# Patient Record
Sex: Female | Born: 1975 | Race: White | Hispanic: Yes | Marital: Married | Smoking: Never smoker
Health system: Southern US, Community
[De-identification: ages and names within clinical notes are randomized; demographics above are authoritative.]

## PROBLEM LIST (undated history)

## (undated) DIAGNOSIS — E669 Obesity, unspecified: Secondary | ICD-10-CM

## (undated) HISTORY — PX: OTHER SURGICAL HISTORY: SHX169

## (undated) HISTORY — DX: Obesity, unspecified: E66.9

---

## 2008-08-27 ENCOUNTER — Ambulatory Visit: Payer: Self-pay | Admitting: Family Medicine

## 2008-11-06 ENCOUNTER — Ambulatory Visit: Payer: Self-pay | Admitting: Obstetrics & Gynecology

## 2015-03-23 ENCOUNTER — Emergency Department
Admission: EM | Admit: 2015-03-23 | Discharge: 2015-03-23 | Disposition: A | Discharge status: Home / Self Care | Payer: Self-pay | Attending: Emergency Medicine | Admitting: Emergency Medicine

## 2015-03-23 ENCOUNTER — Encounter: Payer: Self-pay | Admitting: Emergency Medicine

## 2015-03-23 DIAGNOSIS — B9689 Other specified bacterial agents as the cause of diseases classified elsewhere: Secondary | ICD-10-CM

## 2015-03-23 DIAGNOSIS — L089 Local infection of the skin and subcutaneous tissue, unspecified: Secondary | ICD-10-CM | POA: Insufficient documentation

## 2015-03-23 MED ORDER — OXYCODONE-ACETAMINOPHEN 7.5-325 MG PO TABS: 1.0000 | ORAL_TABLET | Freq: Four times a day (QID) | ORAL | Status: DC | PRN

## 2015-03-23 MED ORDER — IBUPROFEN 800 MG PO TABS: 800.0000 mg | ORAL_TABLET | Freq: Three times a day (TID) | ORAL | Status: DC | PRN

## 2015-03-23 MED ORDER — CEPHALEXIN 500 MG PO CAPS: 500.0000 mg | ORAL_CAPSULE | Freq: Four times a day (QID) | ORAL | Status: AC

## 2015-03-23 NOTE — ED Notes (Signed)
States she developed swelling and pain to left labia since last Thursday. Area swollen and tender

## 2015-03-23 NOTE — Discharge Instructions (Signed)
Take medication as directed and apply warm compresses to the area 3-4 times a day. Return to ER if increased swelling and pain in the next 48 hours. Advised patient did not breast-feed by taking Percocets. Celulitis (Cellulitis)  La celulitis es una infeccin de la piel y del tejido que se encuentra debajo de la piel. El rea infectada generalmente est de color rojo y duele. Ocurre con ms frecuencia en los brazos y en las piernas. CUIDADOS EN EL HOGAR   Tome los antibiticos tal como se le indic. Finalice el [place], aunque comience a Actorsentirse mejor.  Mantenga el brazo o la pierna infectada elevada.  Aplique paos calientes en la zona hasta 4 veces al da.  Tome slo los medicamentos que le haya indicado el mdico.  Cumpla con los controles mdicos segn las indicaciones. SOLICITE AYUDA SI:  Observa una lnea roja en la piel que sale desde la herida.  El rea roja se extiende o se vuelve de color oscuro.  El hueso o la articulacin que se encuentran por debajo de la zona infectada le duelen despus de que la piel se [place].  La infeccin se repite en la misma zona o en una zona diferente.  Tiene un bulto inflamado (hinchado) en la zona infectada.  Aparecen nuevos sntomas.  Tiene fiebre. SOLICITE AYUDA DE INMEDIATO SI:   Se siente muy somnoliento.  Vomita o tiene diarrea.  Se siente enfermo y tiene Savalas Monje Internationaldolores musculares.   Esta informacin no tiene Theme park managercomo fin reemplazar el consejo del mdico. Asegrese de hacerle al mdico cualquier pregunta que tenga.   Document Released: 10/27/2009 Document Revised: 01/28/2015 Elsevier Interactive Patient Education [employer]2016 Elsevier Inc.

## 2015-03-23 NOTE — ED Notes (Signed)
Pt to ed with c./o pelvic pain since wed night states area of bulging pain now unable to sit.

## 2015-03-23 NOTE — ED Provider Notes (Signed)
[facility] Emergency Department Provider Note  ____________________________________________  Time seen: Approximately 12:19 PM  I have reviewed the triage vital signs and the nursing notes.   HISTORY  Chief Complaint Pelvic Pain   Via interpreter HPI Karla Harris is a 39 y.o. female left labia majora pain for 5 days. Patient noticed some swelling and redness status post shaving her pubic area. Patient denies any discharge from his area. Patient denies any fever associated with this complaint. Patient state the pain is a 10 over 10 described as aching. Patient also admits that she is breast-feeding for 243 months old infant. No palliative measures taken for this complaint.   History reviewed. No pertinent past medical history.  There are no active problems to display for this patient.   History reviewed. No pertinent past surgical history.  Current Outpatient Rx  Name  Route  Sig  Dispense  Refill  . cephALEXin (KEFLEX) 500 MG capsule   Oral   Take 1 capsule (500 mg total) by mouth 4 (four) times daily.   40 capsule   0   . ibuprofen (ADVIL,MOTRIN) 800 MG tablet   Oral   Take 1 tablet (800 mg total) by mouth every 8 (eight) hours as needed for moderate pain.   15 tablet   0   . oxyCODONE-acetaminophen (PERCOCET) 7.5-325 MG tablet   Oral   Take 1 tablet by mouth every 6 (six) hours as needed for severe pain.   12 tablet   0     Allergies Review of patient's allergies indicates no known allergies.  History reviewed. No pertinent family history.  Social History Social History  Substance Use Topics  . Smoking status: Never Smoker   . Smokeless tobacco: None  . Alcohol Use: No    Review of Systems Constitutional: No fever/chills Eyes: No visual changes. ENT: No sore throat. Cardiovascular: Denies chest pain. Respiratory: Denies shortness of breath. Gastrointestinal: No abdominal pain.  No nausea, no vomiting.  No diarrhea.  No  constipation. Genitourinary: Negative for dysuria. Musculoskeletal: Negative for back pain. Skin: Negative for rash. Edema and erythema left labial majora. Neurological: Negative for headaches, focal weakness or numbness.  10-point ROS otherwise negative.  ____________________________________________   PHYSICAL EXAM:  VITAL SIGNS: ED Triage Vitals  Enc Vitals Group     BP 03/23/15 1125 129/83 mmHg     Pulse Rate 03/23/15 1125 82     Resp 03/23/15 1125 18     Temp 03/23/15 1125 99.2 F (37.3 C)     Temp Source 03/23/15 1125 Oral     SpO2 03/23/15 1125 100 %     Weight 03/23/15 1125 161 lb (73.029 kg)     Height --      Head Cir --      Peak Flow --      Pain Score 03/23/15 1126 10     Pain Loc --      Pain Edu? --      Excl. in GC? --     Constitutional: Alert and oriented. Well appearing and in no acute distress. Eyes: Conjunctivae are normal. PERRL. EOMI. Head: Atraumatic. Nose: No congestion/rhinnorhea. Mouth/Throat: Mucous membranes are moist.  Oropharynx non-erythematous. Neck: No stridor.   Hematological/Lymphatic/Immunilogical: No cervical lymphadenopathy. Cardiovascular: Normal rate, regular rhythm. Grossly normal heart sounds.  Good peripheral circulation. Respiratory: Normal respiratory effort.  No retractions. Lungs CTAB. Gastrointestinal: Soft and nontender. No distention. No abdominal bruits. No CVA tenderness. Musculoskeletal: No lower extremity tenderness nor  edema.  No joint effusions. Neurologic:  Normal speech and language. No gross focal neurologic deficits are appreciated. No gait instability. Skin:  Skin is warm, dry and intact. No rash noted. Non-fluctuant edema to the left labia majora area. Area is recently been shaved. There is no discharge from this area. Psychiatric: Mood and affect are normal. Speech and behavior are normal.  ____________________________________________   LABS (all labs ordered are listed, but only abnormal results are  displayed)  Labs Reviewed - No data to display ____________________________________________  EKG   ____________________________________________  RADIOLOGY   ____________________________________________   PROCEDURES  Procedure(s) performed: None  Critical Care performed: No  ____________________________________________   INITIAL IMPRESSION / ASSESSMENT AND PLAN / ED COURSE  Pertinent labs & imaging results that were available during my care of the patient were reviewed by me and considered in my medical decision making (see chart for details).  Cellulitis left labia majora area. Advised supportive care at this time. Discussed rationale for not on intervention with incision and drainage. Patient started on Keflex and ibuprofen and Percocets. Advised patient not to breast-feed until she finished a course of Percocets. Advised patient return  To ER 2 daysif condition worsens. ____________________________________________   FINAL CLINICAL IMPRESSION(S) / ED DIAGNOSES  Final diagnoses:  Bacterial skin infection      Joni Reining, PA-C 03/23/15 1227  Sharman Cheek, MD 03/24/15 2200

## 2017-12-04 ENCOUNTER — Encounter: Payer: Self-pay | Admitting: *Deleted

## 2017-12-04 ENCOUNTER — Other Ambulatory Visit: Payer: Self-pay | Admitting: *Deleted

## 2017-12-04 ENCOUNTER — Ambulatory Visit
Admission: RE | Admit: 2017-12-04 | Discharge: 2017-12-04 | Disposition: A | Discharge status: Home / Self Care | Payer: Self-pay | Source: Ambulatory Visit | Attending: Oncology | Admitting: Oncology

## 2017-12-04 ENCOUNTER — Encounter (INDEPENDENT_AMBULATORY_CARE_PROVIDER_SITE_OTHER): Payer: Self-pay

## 2017-12-04 ENCOUNTER — Ambulatory Visit: Payer: Self-pay | Attending: Oncology | Admitting: *Deleted

## 2017-12-04 VITALS — BP 110/73 | HR 69 | Temp 98.2°F | Ht 62.0 in | Wt 177.0 lb

## 2017-12-04 DIAGNOSIS — Z0000 Encounter for general adult medical examination without abnormal findings: Secondary | ICD-10-CM | POA: Insufficient documentation

## 2017-12-04 DIAGNOSIS — R920 Mammographic microcalcification found on diagnostic imaging of breast: Secondary | ICD-10-CM

## 2017-12-04 NOTE — Progress Notes (Signed)
  Subjective:     Patient ID: Karla Harris, female   DOB: 1975/07/23, 42 y.o.   MRN: [id number]  HPI   Review of Systems     Objective:   Physical Exam  Pulmonary/Chest: Right breast exhibits no inverted nipple, no mass, no nipple discharge, no skin change and no tenderness. Left breast exhibits no inverted nipple, no mass, no nipple discharge, no skin change and no tenderness.         Assessment:     42 year old Hispanic female referred to [facility] by [facility] baseline [facility].  Orson SlickJacqui, the interpreter present during the interview and exam.  Patient states she has bilateral breast tenderness 1 week prior to her menstrual cycle and 1 week after.  Describes as all over pain.  Discussed hormones and the menstrual cycle, and the likelihood her breast pain is related to her hormones.  She is to keep a chart to ensure the pain is associated with her cycle.  Clinical breast exam with bilateral symmetrical fibroglandular like tissue.  Taught self breast awareness.  Last pap on 06/03/15 was HPV negative ASCUS.  Informed patient next pap due in 2020.  Patient has been screened for eligibility.  She does not have any insurance, Medicare or Medicaid.  She also meets financial eligibility.  Hand-out given on the Affordable Care Act. Risk Assessment    Risk Scores      12/04/2017   Last edited by: Jim LikeLambert, Kordelia Severin M, RN   5-year risk: 0.3 %   Lifetime risk: 6.4 %            Plan:     Baseline screening mammogram ordered.  Will follow-up per [facility] protocol.

## 2017-12-04 NOTE — Patient Instructions (Signed)
Gave patient hand-out, Women Staying Healthy, Active and Well from [facility], with education on breast health, pap smears, heart and colon health. 

## 2017-12-08 ENCOUNTER — Ambulatory Visit
Admission: RE | Admit: 2017-12-08 | Discharge: 2017-12-08 | Disposition: A | Discharge status: Home / Self Care | Payer: Self-pay | Source: Ambulatory Visit | Attending: Oncology | Admitting: Oncology

## 2017-12-08 DIAGNOSIS — R920 Mammographic microcalcification found on diagnostic imaging of breast: Secondary | ICD-10-CM

## 2018-01-12 ENCOUNTER — Other Ambulatory Visit: Payer: Self-pay | Admitting: *Deleted

## 2018-01-12 ENCOUNTER — Encounter: Payer: Self-pay | Admitting: *Deleted

## 2018-01-12 DIAGNOSIS — R920 Mammographic microcalcification found on diagnostic imaging of breast: Secondary | ICD-10-CM

## 2018-01-12 NOTE — Progress Notes (Signed)
Letter mailed to inform patient of her 6 month follow-up mammogram appointment on 06/12/18 @ 11:20.  HSIS to [place].

## 2018-06-06 ENCOUNTER — Encounter: Payer: Self-pay | Admitting: *Deleted

## 2018-06-06 NOTE — Progress Notes (Signed)
Patient called Karla Harris to inform Karla Harris she is currently pregnant.  She was scheduled for a six month follow-up mammogram this month.  She was notified by Karla Harris and Karla Harris, the interpreter that we could not do a mammogram on her while pregnant.  She is to call Karla Harris after completion of breast feeding to reschedule her mammogram.  She will need to return to [facility] again at that time.

## 2018-11-20 DIAGNOSIS — O99810 Abnormal glucose complicating pregnancy: Secondary | ICD-10-CM

## 2018-11-20 DIAGNOSIS — Z9289 Personal history of other medical treatment: Secondary | ICD-10-CM

## 2018-11-20 DIAGNOSIS — Z559 Problems related to education and literacy, unspecified: Secondary | ICD-10-CM

## 2018-11-20 DIAGNOSIS — O1212 Gestational proteinuria, second trimester: Secondary | ICD-10-CM

## 2018-11-20 DIAGNOSIS — O09529 Supervision of elderly multigravida, unspecified trimester: Secondary | ICD-10-CM | POA: Insufficient documentation

## 2018-11-20 DIAGNOSIS — O09523 Supervision of elderly multigravida, third trimester: Secondary | ICD-10-CM

## 2018-11-20 DIAGNOSIS — E669 Obesity, unspecified: Secondary | ICD-10-CM | POA: Insufficient documentation

## 2018-11-20 HISTORY — DX: Gestational proteinuria, second trimester: O12.12

## 2018-11-20 HISTORY — DX: Abnormal glucose complicating pregnancy: O99.810

## 2018-11-21 ENCOUNTER — Ambulatory Visit: Payer: Self-pay | Admitting: Nurse Practitioner

## 2018-11-21 ENCOUNTER — Other Ambulatory Visit: Payer: Self-pay

## 2018-11-21 ENCOUNTER — Other Ambulatory Visit (HOSPITAL_COMMUNITY): Payer: Self-pay | Admitting: Family Medicine

## 2018-11-21 VITALS — BP 95/62 | Temp 98.6°F | Wt 191.2 lb

## 2018-11-21 DIAGNOSIS — O1212 Gestational proteinuria, second trimester: Secondary | ICD-10-CM

## 2018-11-21 DIAGNOSIS — O99013 Anemia complicating pregnancy, third trimester: Secondary | ICD-10-CM

## 2018-11-21 DIAGNOSIS — E669 Obesity, unspecified: Secondary | ICD-10-CM

## 2018-11-21 DIAGNOSIS — O99019 Anemia complicating pregnancy, unspecified trimester: Secondary | ICD-10-CM

## 2018-11-21 DIAGNOSIS — O99810 Abnormal glucose complicating pregnancy: Secondary | ICD-10-CM

## 2018-11-21 DIAGNOSIS — O09523 Supervision of elderly multigravida, third trimester: Secondary | ICD-10-CM

## 2018-11-21 HISTORY — DX: Anemia complicating pregnancy, unspecified trimester: O99.019

## 2018-11-21 LAB — URINALYSIS
Bilirubin, UA: NEGATIVE
Glucose, UA: NEGATIVE
Ketones, UA: NEGATIVE
Leukocytes,UA: NEGATIVE
Nitrite, UA: NEGATIVE
Specific Gravity, UA: 1.02 (ref 1.005–1.030)
Urobilinogen, Ur: 0.2 mg/dL (ref 0.2–1.0)
pH, UA: 7 (ref 5.0–7.5)

## 2018-11-21 LAB — HEMOGLOBIN: Hemoglobin: 10.8 g/dL — ABNORMAL LOW (ref 11.1–15.9)

## 2018-11-21 LAB — OB RESULTS CONSOLE HIV ANTIBODY (ROUTINE TESTING): HIV: NONREACTIVE

## 2018-11-21 MED ORDER — FERROUS SULFATE 325 (65 FE) MG PO TBEC: 325.0000 mg | DELAYED_RELEASE_TABLET | Freq: Every day | ORAL | 0 refills | Status: DC

## 2018-11-21 NOTE — Progress Notes (Signed)
Patient here for repeat 3 hr. Gtt. Hasn't been seen 10-28-2018 due to death of daughter. Kick counts reviewed with patient today. Patient states she does not want to reschedule [facility] MFM appt. Jenetta Downer, RN

## 2018-11-21 NOTE — Progress Notes (Signed)
    PRENATAL VISIT NOTE  Subjective:  Karla Harris is a 43 y.o. [id number] at [redacted]w[redacted]d being seen today for ongoing prenatal care.  She is currently monitored for the following issues for this high-risk pregnancy and has Problem with literacy; Obesity (BMI 30-39.9); Abnormal maternal glucose tolerance, antepartum; Proteinuria complicating pregnancy, second trimester; Supervision of elderly multigravida in third trimester; and History of positive PPD on their problem list.  Patient reports no complaints.   .  .   . denies vomiting, abdominal pain, fussiness, diarrhea, cough and difficulty breathing leaking of fluid/ROM.   The following portions of the patient's history were reviewed and updated as appropriate: allergies, current medications, past family history, past medical history, past social history, past surgical history and problem list. Problem list updated.  Objective:   Vitals:   11/21/18 0915  BP: 95/62  Temp: 98.6 F (37 C)  Weight: 191 lb 3.2 oz (86.7 kg)    Fetal Status:           General:  Alert, oriented and cooperative. Patient is in no acute distress.  Skin: Skin is warm and dry. No rash noted.   Cardiovascular: Normal heart rate noted  Respiratory: Normal respiratory effort, no problems with respiration noted  Abdomen: Soft, gravid, appropriate for gestational age.        Pelvic: Cervical exam deferred        Extremities: Normal range of motion.     Mental Status: Normal mood and affect. Normal behavior. Normal judgment and thought content.   Assessment and Plan:  Pregnancy: [id number] at [redacted]w[redacted]d  1. Obesity (BMI 30-39.9) Discussed proper weight gain during pregnancy 16 lb 3.2 oz (7.348 kg)   2. Abnormal maternal glucose tolerance, antepartum Discussed WIC services  3. Proteinuria complicating pregnancy, second trimester Plan to continue to monitor urine for protein  4. Supervision of elderly multigravida in third trimester Client doing well - admits  to "Inflammed veins in vagina while standing" - advised cool compresses and lying on side while in bed - advised to notify clinic if symptoms worsen or persists  Follow up with lab work may come in earlier [facility] to - Feet a little swelling - advised client to increase water to 8 glasses daily and limit salt in daily diet.  Admits to occasional - BH contractions   Preterm labor symptoms and general obstetric precautions including but not limited to vaginal bleeding, contractions, leaking of fluid and fetal movement were reviewed in detail with the patient. Please refer to After Visit Summary for other counseling recommendations.  No follow-ups on file.  No future appointments.  Berniece Andreas, NP

## 2018-11-22 LAB — GLUCOSE TOLERANCE TEST, 6 HOUR
Glucose, 1 Hour GTT: 145 mg/dL (ref 65–199)
Glucose, 2 hour: 132 mg/dL (ref 65–139)
Glucose, 3 hour: 105 mg/dL (ref 65–109)
Glucose, GTT - Fasting: 80 mg/dL (ref 65–99)

## 2018-11-22 LAB — FE+CBC/D/PLT+TIBC+FER+RETIC
Basophils Absolute: 0 10*3/uL (ref 0.0–0.2)
Basos: 0 %
EOS (ABSOLUTE): 0.1 10*3/uL (ref 0.0–0.4)
Eos: 1 %
Ferritin: 12 ng/mL — ABNORMAL LOW (ref 15–150)
Hematocrit: 33.1 % — ABNORMAL LOW (ref 34.0–46.6)
Hemoglobin: 11.1 g/dL (ref 11.1–15.9)
Immature Grans (Abs): 0.1 10*3/uL (ref 0.0–0.1)
Immature Granulocytes: 1 %
Iron Saturation: 11 % — ABNORMAL LOW (ref 15–55)
Iron: 56 ug/dL (ref 27–159)
Lymphocytes Absolute: 1.7 10*3/uL (ref 0.7–3.1)
Lymphs: 25 %
MCH: 31.5 pg (ref 26.6–33.0)
MCHC: 33.5 g/dL (ref 31.5–35.7)
MCV: 94 fL (ref 79–97)
Monocytes Absolute: 0.5 10*3/uL (ref 0.1–0.9)
Monocytes: 7 %
Neutrophils Absolute: 4.4 10*3/uL (ref 1.4–7.0)
Neutrophils: 66 %
Platelets: 219 10*3/uL (ref 150–450)
RBC: 3.52 x10E6/uL — ABNORMAL LOW (ref 3.77–5.28)
RDW: 14.3 % (ref 11.7–15.4)
Retic Ct Pct: 2.7 % — ABNORMAL HIGH (ref 0.6–2.6)
Total Iron Binding Capacity: 501 ug/dL — ABNORMAL HIGH (ref 250–450)
UIBC: 445 ug/dL — ABNORMAL HIGH (ref 131–425)
WBC: 6.8 10*3/uL (ref 3.4–10.8)

## 2018-11-22 LAB — RPR: RPR Ser Ql: NONREACTIVE

## 2018-12-05 ENCOUNTER — Other Ambulatory Visit: Payer: Self-pay

## 2018-12-05 ENCOUNTER — Ambulatory Visit: Payer: Self-pay | Admitting: Nurse Practitioner

## 2018-12-05 DIAGNOSIS — O1212 Gestational proteinuria, second trimester: Secondary | ICD-10-CM

## 2018-12-05 DIAGNOSIS — O99810 Abnormal glucose complicating pregnancy: Secondary | ICD-10-CM

## 2018-12-05 DIAGNOSIS — O09523 Supervision of elderly multigravida, third trimester: Secondary | ICD-10-CM

## 2018-12-05 DIAGNOSIS — O99019 Anemia complicating pregnancy, unspecified trimester: Secondary | ICD-10-CM

## 2018-12-05 DIAGNOSIS — E669 Obesity, unspecified: Secondary | ICD-10-CM

## 2018-12-05 NOTE — Progress Notes (Signed)
Phone call to client to initiate telehealth appt. Verified I was speaking with client using 2 identifiers. Client denies international travel for self or FOB since pregnant. Kick count counseling done. Client appt scheduled for 12/19/2018. Shona Needles, RN

## 2018-12-05 NOTE — Progress Notes (Signed)
   TELEPHONE OBSTETRICS VISIT ENCOUNTER NOTE  I connected with Karla Harris on 12/05/18 at 11:00 AM EDT by telephone at home and verified that I am speaking with the correct person using two identifiers.   I discussed the limitations, risks, security and privacy concerns of performing an evaluation and management service by telephone and the availability of in person appointments. I also discussed with the patient that there may be a patient responsible charge related to this service. The patient expressed understanding and agreed to proceed.  Subjective:  Karla Harris is a 43 y.o. [id number] at [redacted]w[redacted]d being followed for ongoing prenatal care.  She is currently monitored for the following issues for this high-risk pregnancy and has Problem with literacy; Obesity (BMI 30-39.9); Abnormal maternal glucose tolerance, antepartum; Proteinuria complicating pregnancy, second trimester; Supervision of elderly multigravida in third trimester; History of positive PPD; and Anemia of mother in pregnancy, antepartum on their problem list.  Patient reports - trace swelling in LE's. Reports fetal movement. Denies any contractions, bleeding or leaking of fluid.   The following portions of the patient's history were reviewed and updated as appropriate: allergies, current medications, past family history, past medical history, past social history, past surgical history and problem list.   Objective:   General:  Alert, oriented and cooperative.   Mental Status: Normal mood and affect perceived. Normal judgment and thought content.  Rest of physical exam deferred due to type of encounter  Assessment and Plan:  Pregnancy: G5P4004 at [redacted]w[redacted]d 1. Anemia of mother in pregnancy, antepartum Client admits to taking iron tabs daily opposite of PNV  2. Supervision of elderly multigravida in third trimester Client doing well Discussed with client - drinking at least 8 glasses of water daily to decrease swelling and  elevate LE's while resting Denies any additional concerns or complaints at this time  Preterm labor symptoms and general obstetric precautions including but not limited to vaginal bleeding, contractions, leaking of fluid and fetal movement were reviewed in detail with the patient.  I discussed the assessment and treatment plan with the patient. The patient was provided an opportunity to ask questions and all were answered. The patient agreed with the plan and demonstrated an understanding of the instructions. The patient was advised to call back or seek an in-person office evaluation/go to the hospital for any urgent or concerning symptoms.  Please refer to After Visit Summary for other counseling recommendations.   I provided 10 minutes of non-face-to-face time during this encounter.  No follow-ups on file.  No future appointments.  Berniece Andreas, NP

## 2018-12-05 NOTE — Progress Notes (Signed)
  Subjective:  Karla Harris is a 43 y.o. female being seen today for an STI screening visit. The patient reports they do have symptoms.  Patient has the following medical conditionshas Problem with literacy; Obesity (BMI 30-39.9); Abnormal maternal glucose tolerance, antepartum; Proteinuria complicating pregnancy, second trimester; Supervision of elderly multigravida in third trimester; History of positive PPD; and Anemia of mother in pregnancy, antepartum on their problem list.  Chief Complaint  Patient presents with  . Routine Prenatal Visit    telehealth    Patient reports  HPI   See flowsheet for further details and programmatic requirements.    The following portions of the patient's history were reviewed and updated as appropriate: allergies, current medications, past family history, past medical history, past social history, past surgical history and problem list. Problem list updated.  Objective:  There were no vitals filed for this visit.  Physical Exam    Assessment and Plan:    Return in about 2 weeks (around 12/19/2018) for 36 wk labs.  Future Appointments  Date Time Provider [place]  12/19/2018 10:40 AM [facility] PROVIDER AC-[facility] None    Berniece Andreas, NP

## 2018-12-09 NOTE — Progress Notes (Signed)
     Subjective:  Karla Harris is a 43 y.o. female The patient reports they do not have symptoms.  Patient has the following medical conditionshas Problem with literacy; Obesity (BMI 30-39.9); Abnormal maternal glucose tolerance, antepartum; Proteinuria complicating pregnancy, second trimester; Supervision of elderly multigravida in third trimester; History of positive PPD; and Anemia of mother in pregnancy, antepartum on their problem list.  Chief Complaint  Patient presents with  . Routine Prenatal Visit    Patient reports  HPI   See flowsheet for further details and programmatic requirements.    The following portions of the patient's history were reviewed and updated as appropriate: allergies, current medications, past family history, past medical history, past social history, past surgical history and problem list. Problem list updated.  Objective:   Vitals:   11/21/18 0915  BP: 95/62  Temp: 98.6 F (37 C)  Weight: 191 lb 3.2 oz (86.7 kg)    Physical Exam    Assessment and Plan:  Karla Harris is a 43 y.o. female

## 2018-12-19 ENCOUNTER — Other Ambulatory Visit: Payer: Self-pay | Admitting: Family Medicine

## 2018-12-19 ENCOUNTER — Other Ambulatory Visit: Payer: Self-pay

## 2018-12-19 ENCOUNTER — Ambulatory Visit: Payer: Self-pay | Admitting: Nurse Practitioner

## 2018-12-19 VITALS — BP 123/67 | Temp 98.1°F | Wt 196.0 lb

## 2018-12-19 DIAGNOSIS — O99019 Anemia complicating pregnancy, unspecified trimester: Secondary | ICD-10-CM

## 2018-12-19 DIAGNOSIS — O09523 Supervision of elderly multigravida, third trimester: Secondary | ICD-10-CM

## 2018-12-19 LAB — URINALYSIS
Bilirubin, UA: NEGATIVE
Glucose, UA: NEGATIVE
Ketones, UA: NEGATIVE
Nitrite, UA: NEGATIVE
Specific Gravity, UA: 1.015 (ref 1.005–1.030)
Urobilinogen, Ur: 0.2 mg/dL (ref 0.2–1.0)
pH, UA: 7.5 (ref 5.0–7.5)

## 2018-12-19 LAB — HEMOGLOBIN, FINGERSTICK: Hemoglobin: 11.9 g/dL (ref 11.1–15.9)

## 2018-12-19 NOTE — Progress Notes (Signed)
  PRENATAL VISIT NOTE  Subjective:  Karla Harris is a 43 y.o. [id number] at [redacted]w[redacted]d being seen today for ongoing prenatal care.  She is currently monitored for the following issues for this high-risk pregnancy and has Problem with literacy; Obesity (BMI 30-39.9); Abnormal maternal glucose tolerance, antepartum; Proteinuria complicating pregnancy, second trimester; Supervision of elderly multigravida in third trimester; History of positive PPD; and Anemia of mother in pregnancy, antepartum on their problem list.  Patient reports no complaints.   .  .   . Denies leaking of fluid/ROM.   The following portions of the patient's history were reviewed and updated as appropriate: allergies, current medications, past family history, past medical history, past social history, past surgical history and problem list. Problem list updated.  Objective:   Vitals:   12/19/18 1102  BP: 123/67  Temp: 98.1 F (36.7 C)  Weight: 196 lb (88.9 kg)    Fetal Status:           General:  Alert, oriented and cooperative. Patient is in no acute distress.  Skin: Skin is warm and dry. No rash noted.   Cardiovascular: Normal heart rate noted  Respiratory: Normal respiratory effort, no problems with respiration noted  Abdomen: Soft, gravid, appropriate for gestational age.        Pelvic: Cervical exam deferred        Extremities: Normal range of motion.     Mental Status: Normal mood and affect. Normal behavior. Normal judgment and thought content.   Assessment and Plan:  Pregnancy: H7D4287 at [redacted]w[redacted]d  1. Supervision of elderly multigravida in third trimester Here for 36 wk labs  - Culture, beta strep (group b only) - Chlamydia/Gonococcus/Trichomonas, NAA - Hemoglobin, fingerstick - Urinalysis   - Protein / creatinine ratio, urine  2. Anemia of mother in pregnancy, antepartum   3. Advanced maternal age in multigravida, third trimester Client here for 36 wk labs, including Hgb PHQ 9 score - 1 - no  further action required at this time Post term form completed due to [facility]   - Protein / creatinine ratio, urine Client advised to go to ED for evaluation if experiencing the following symptoms: HA , blurry vision, RUQ pain.     Preterm labor symptoms and general obstetric precautions including but not limited to vaginal bleeding, contractions, leaking of fluid and fetal movement were reviewed in detail with the patient. Please refer to After Visit Summary for other counseling recommendations.  Return in about 5 days (around 12/24/2018) for BP check.  Future Appointments  Date Time Provider [place]  12/26/2018 11:00 AM [facility] PROVIDER AC-[facility] None    Berniece Andreas, NP

## 2018-12-19 NOTE — Progress Notes (Signed)
In for visit; reports taking PNV & Fe; desires Depo pp; declines self collection of cultures today Debera Lat, RN

## 2018-12-21 LAB — STREP GP B NAA: Strep Gp B NAA: NEGATIVE

## 2018-12-21 LAB — PROTEIN / CREATININE RATIO, URINE
Creatinine, Urine: 32.7 mg/dL
Protein, Ur: 38.3 mg/dL
Protein/Creat Ratio: 1171 mg/g creat — ABNORMAL HIGH (ref 0–200)

## 2018-12-24 ENCOUNTER — Ambulatory Visit: Payer: Self-pay | Admitting: Nurse Practitioner

## 2018-12-25 LAB — CHLAMYDIA/GC NAA, CONFIRMATION
Chlamydia trachomatis, NAA: NEGATIVE
Neisseria gonorrhoeae, NAA: NEGATIVE

## 2018-12-26 ENCOUNTER — Other Ambulatory Visit: Payer: Self-pay

## 2018-12-26 ENCOUNTER — Telehealth: Payer: Self-pay

## 2018-12-26 ENCOUNTER — Ambulatory Visit: Payer: Self-pay | Admitting: Nurse Practitioner

## 2018-12-26 DIAGNOSIS — O09523 Supervision of elderly multigravida, third trimester: Secondary | ICD-10-CM

## 2018-12-26 DIAGNOSIS — O99810 Abnormal glucose complicating pregnancy: Secondary | ICD-10-CM

## 2018-12-26 DIAGNOSIS — O1212 Gestational proteinuria, second trimester: Secondary | ICD-10-CM

## 2018-12-26 DIAGNOSIS — O99019 Anemia complicating pregnancy, unspecified trimester: Secondary | ICD-10-CM

## 2018-12-26 DIAGNOSIS — E669 Obesity, unspecified: Secondary | ICD-10-CM

## 2018-12-26 NOTE — Progress Notes (Signed)
Negative responses to Covid screening questions. Denies international travel for self or FOB since pregnant. Taking PNV and iron tablet daily. Shona Needles, RN

## 2018-12-26 NOTE — Progress Notes (Signed)
   PRENATAL VISIT NOTE  Subjective:  Karla Harris is a 43 y.o. [id number] at [redacted]w[redacted]d being seen today for ongoing prenatal care.  She is currently monitored for the following issues for this high-risk pregnancy and has Problem with literacy; Obesity (BMI 30-39.9); Abnormal maternal glucose tolerance, antepartum; Proteinuria complicating pregnancy, second trimester; Supervision of elderly multigravida in third trimester; History of positive PPD; and Anemia of mother in pregnancy, antepartum on their problem list.  Patient reports no complaints.  Contractions: Not present. Vag. Bleeding: None.  Movement: Present. Denies leaking of fluid/ROM.   The following portions of the patient's history were reviewed and updated as appropriate: allergies, current medications, past family history, past medical history, past social history, past surgical history and problem list. Problem list updated.  Objective:   Vitals:   12/26/18 1103  BP: 110/72  Temp: 98.7 F (37.1 C)  Weight: 196 lb 6.4 oz (89.1 kg)    Fetal Status: Fetal Heart Rate (bpm): 150 Fundal Height: 38 cm Movement: Present     General:  Alert, oriented and cooperative. Patient is in no acute distress.  Skin: Skin is warm and dry. No rash noted.   Cardiovascular: Normal heart rate noted  Respiratory: Normal respiratory effort, no problems with respiration noted  Abdomen: Soft, gravid, appropriate for gestational age.  Pain/Pressure: Absent     Pelvic: Cervical exam deferred        Extremities: Normal range of motion.  Edema: Trace  Mental Status: Normal mood and affect. Normal behavior. Normal judgment and thought content.   Assessment and Plan:  Pregnancy: [id number] at [redacted]w[redacted]d  1. Supervision of elderly multigravida in third trimester Client admits to doing well Denies any additional concerns or questions at this time  2. Anemia of mother in pregnancy, antepartum Discussed taking iron tabs separately from PNV  4. Abnormal maternal  glucose tolerance, antepartum Plan for completion of IOL form for [facility] during next MV appointment on 12/31/2018 - cervical check at that time     Term labor symptoms and general obstetric precautions including but not limited to vaginal bleeding, contractions, leaking of fluid and fetal movement were reviewed in detail with the patient. Please refer to After Visit Summary for other counseling recommendations.  Return in about 5 days (around 12/31/2018) for cevical check - IOL for [facility].  Future Appointments  Date Time Provider [place]  12/31/2018 10:20 AM [facility] PROVIDER AC-[facility] None    Berniece Andreas, NP

## 2018-12-26 NOTE — Telephone Encounter (Signed)
Phone call to [facility] at [facility] to schedule 40 week IOL for 01/12/2019.  IOL schedued for 01/12/2019 and [facility] will call client between 7 am and 11 am with arrival time. Phone call to client and notified of above - agreeable with plan. Shona Needles, RN

## 2018-12-31 ENCOUNTER — Ambulatory Visit: Payer: Self-pay | Admitting: Nurse Practitioner

## 2018-12-31 ENCOUNTER — Other Ambulatory Visit: Payer: Self-pay

## 2018-12-31 VITALS — BP 110/75 | Temp 98.2°F | Wt 198.0 lb

## 2018-12-31 DIAGNOSIS — O09523 Supervision of elderly multigravida, third trimester: Secondary | ICD-10-CM

## 2018-12-31 DIAGNOSIS — O1212 Gestational proteinuria, second trimester: Secondary | ICD-10-CM

## 2018-12-31 LAB — URINALYSIS
Bilirubin, UA: NEGATIVE
Glucose, UA: NEGATIVE
Ketones, UA: NEGATIVE
Leukocytes,UA: NEGATIVE
Nitrite, UA: NEGATIVE
Specific Gravity, UA: 1.025 (ref 1.005–1.030)
Urobilinogen, Ur: 0.2 mg/dL (ref 0.2–1.0)
pH, UA: 7 (ref 5.0–7.5)

## 2018-12-31 NOTE — Progress Notes (Signed)
Denies s/s or exposure to Covid-19. Taking PNV and Iron QD, denies ED/hospital visits since last RV. Reviewed with patient [facility] IOL date and time and to report to [facility] 01/10/19 for Covid testing. Information given on location. Patient verbalized understanding of same. Urine dip today.  Hal Morales, RN

## 2018-12-31 NOTE — Progress Notes (Signed)
   PRENATAL VISIT NOTE  Subjective:  Karla Harris is a 43 y.o. [id number] at [redacted]w[redacted]d being seen today for ongoing prenatal care.  She is currently monitored for the following issues for this high-risk pregnancy and has Problem with literacy; Obesity (BMI 30-39.9); Abnormal maternal glucose tolerance, antepartum; Proteinuria complicating pregnancy, second trimester; Supervision of elderly multigravida in third trimester; History of positive PPD; and Anemia of mother in pregnancy, antepartum on their problem list.  Patient reports no complaints.   .  .  Movement: Present. Denies leaking of fluid/ROM.   The following portions of the patient's history were reviewed and updated as appropriate: allergies, current medications, past family history, past medical history, past social history, past surgical history and problem list. Problem list updated.  Objective:   Vitals:   12/31/18 1005  BP: 110/75  Temp: 98.2 F (36.8 C)  Weight: 198 lb (89.8 kg)    Fetal Status: Fetal Heart Rate (bpm): 156 Fundal Height: 38 cm Movement: Present     General:  Alert, oriented and cooperative. Patient is in no acute distress.  Skin: Skin is warm and dry. No rash noted.   Cardiovascular: Normal heart rate noted  Respiratory: Normal respiratory effort, no problems with respiration noted  Abdomen: Soft, gravid, appropriate for gestational age.        Pelvic: Cervical exam deferred        Extremities: Normal range of motion.  Edema: Trace  Mental Status: Normal mood and affect. Normal behavior. Normal judgment and thought content.   Assessment and Plan:  Pregnancy: [id number] at [redacted]w[redacted]d  1. Supervision of elderly multigravida in third trimester Client denies any complaints at this time OB ROS flow sheet  - Urinalysis (Urine Dip) - 2+ protein noted BP WNL Client denies any dizziness or changes in vision  - Protein / creatinine ratio, urine  (Spot) - await results  2. Proteinuria complicating pregnancy in  second trimester Await results Plan for client to RTC on 01/03/2019 for BP check   - Urinalysis (Urine Dip) - Protein / creatinine ratio, urine  (Spot) - await results  Client scheduled for COVID testing on 01/10/2019 IOL scheduled for 01/12/2019  Term labor symptoms and general obstetric precautions including but not limited to vaginal bleeding, contractions, leaking of fluid and fetal movement were reviewed in detail with the patient. Please refer to After Visit Summary for other counseling recommendations.  Return in about 1 year (around 01/03/2020) for routine prenatal care - recheck BP/urine dip.  Future Appointments  Date Time Provider [place]  01/03/2019 10:40 AM [facility] PROVIDER [facility] None    Berniece Andreas, NP

## 2019-01-01 LAB — PROTEIN / CREATININE RATIO, URINE
Creatinine, Urine: 61.2 mg/dL
Protein, Ur: 72.6 mg/dL
Protein/Creat Ratio: 1186 mg/g creat — ABNORMAL HIGH (ref 0–200)

## 2019-01-03 ENCOUNTER — Ambulatory Visit: Payer: Self-pay

## 2019-01-16 ENCOUNTER — Ambulatory Visit: Payer: Self-pay

## 2019-01-21 ENCOUNTER — Ambulatory Visit: Payer: Self-pay

## 2019-01-22 ENCOUNTER — Encounter: Payer: Self-pay | Admitting: Family Medicine

## 2019-01-22 DIAGNOSIS — O09529 Supervision of elderly multigravida, unspecified trimester: Secondary | ICD-10-CM | POA: Insufficient documentation

## 2019-01-24 ENCOUNTER — Telehealth: Payer: Self-pay | Admitting: Family Medicine

## 2019-02-06 NOTE — Addendum Note (Signed)
Addended by: Jakeira Seeman on: 02/06/2019 03:54 PM   Modules accepted: Orders  

## 2019-02-06 NOTE — Addendum Note (Signed)
Addended by: Cletis Media on: 02/06/2019 03:54 PM   Modules accepted: Orders

## 2019-02-07 NOTE — Addendum Note (Signed)
Addended by: Cletis Media on: 02/07/2019 11:11 AM   Modules accepted: Orders

## 2019-02-13 ENCOUNTER — Other Ambulatory Visit: Payer: Self-pay

## 2019-02-13 ENCOUNTER — Encounter: Payer: Self-pay | Admitting: Advanced Practice Midwife

## 2019-02-13 ENCOUNTER — Ambulatory Visit: Payer: Self-pay | Admitting: Advanced Practice Midwife

## 2019-02-13 VITALS — BP 117/77 | Ht 62.0 in | Wt 175.2 lb

## 2019-02-13 DIAGNOSIS — Z9289 Personal history of other medical treatment: Secondary | ICD-10-CM

## 2019-02-13 DIAGNOSIS — Z3009 Encounter for other general counseling and advice on contraception: Secondary | ICD-10-CM

## 2019-02-13 DIAGNOSIS — O1212 Gestational proteinuria, second trimester: Secondary | ICD-10-CM

## 2019-02-13 LAB — URINALYSIS
Bilirubin, UA: NEGATIVE
Glucose, UA: NEGATIVE
Ketones, UA: NEGATIVE
Nitrite, UA: NEGATIVE
Specific Gravity, UA: 1.01 (ref 1.005–1.030)
Urobilinogen, Ur: 0.2 mg/dL (ref 0.2–1.0)
pH, UA: 6.5 (ref 5.0–7.5)

## 2019-02-13 LAB — WET PREP FOR TRICH, YEAST, CLUE
Trichomonas Exam: NEGATIVE
Yeast Exam: NEGATIVE

## 2019-02-13 LAB — HEMOGLOBIN, FINGERSTICK: Hemoglobin: 14.9 g/dL (ref 11.1–15.9)

## 2019-02-13 MED ORDER — MULTI-VITAMIN/MINERALS PO TABS: 1.0000 | ORAL_TABLET | Freq: Every day | ORAL | 0 refills | Status: DC

## 2019-02-13 NOTE — Progress Notes (Signed)
Client has second grade education and unable to read. Agency interpreter reading all 6 forms required for completion at pp visit. Per client had Depo on day of discharge at [facility] (01/03/2019). Rich Number, RN Consent for Depo signed. Wet mount and hgb results reviewed - no intervention required per standing order. Rich Number, RN  Urine dip reviewed by Ola Spurr CNM and no additional orders given. Rich Number, RN

## 2019-02-13 NOTE — Progress Notes (Signed)
Post Partum Exam  Karla Harris is a 43 y.o. [id number]  Nonsmoking female with second grade education who presents for a postpartum visit. She is 6 weeks postpartum following a spontaneous vaginal delivery. Lives with husband and 5 children; 42 yo daughter helps her with kids. I have fully reviewed the prenatal and intrapartum course. The delivery was at 38 4/7 gestational weeks.  Anesthesia: epidural. Postpartum course has been good. Baby's course has been good. Baby is feeding by bottle - formula. Bleeding thin lochia. Bowel function is normal. Bladder function is normal. Patient is not sexually active. Contraception method is Depo-Provera injections and received first dose 01/03/19 in hospital  Postpartum depression screening: Edinburgh Postnatal Depression Scale - 02/13/19 1406      Edinburgh Postnatal Depression Scale:  In the Past 7 Days   I have been able to laugh and see the funny side of things.  0    I have looked forward with enjoyment to things.  0    I have blamed myself unnecessarily when things went wrong.  0    I have been anxious or worried for no good reason.  0    I have felt scared or panicky for no good reason.  0    Things have been getting on top of me.  0    I have been so unhappy that I have had difficulty sleeping.  0    I have felt sad or miserable.  0    I have been so unhappy that I have been crying.  0    The thought of harming myself has occurred to me.  0    Edinburgh Postnatal Depression Scale Total  0         Last pap smear done 2019 and was Normal  Review of Systems A comprehensive review of systems was negative.    Objective:  BP 117/77   Ht 5\' 2"  (1.575 m)   Wt 175 lb 3.2 oz (79.5 kg)   LMP 03/30/2018   Breastfeeding Unknown Comment: light vaginal spotting  BMI 32.04 kg/m   General:  alert and moderately obese   Breasts:  negative findings: normal in size and symmetry  Lungs: clear to auscultation bilaterally  Heart:  regular rate and  rhythm, S1, S2 normal, no murmur, click, rub or gallop  Abdomen: normal findings: increased adipose, soft without tenderness   Vulva:  left labial varicosity pt states was present during pregnancy  Vagina: poor tone, erythematous, c/o internal burning, light milky pink leukorrhea  Cervix:  -CMT  Corpus: normal size, contour, position, consistency, mobility, non-tender  Adnexa:  normal adnexa  Rectal Exam: Not performed.        Assessment:    6 wk postpartum exam. Pap smear wnl 2019.   Plan:   1. Contraception: Depo-Provera injections 2. Infant feeding:  patient is currently feeding with formula  If breastmilk feeding patient was given letter for employer to provide appropriate pumping time to express breastmilk.  3. Mood: EPDS is low risk. Reviewed resources and that mood sx in first year after pregnancy are considered related to pregnancy and to reach out for help at [facility] if needed. Discussed [facility] as link to care and availability of LCSW for counseling  4. Chronic Medical Conditions:   list chronic medical problems and follow up/management plan.  Proteinuria during pregnancy with workup after birth while in hospital at [facility]:  Renal u/s 01/02/19 wnl, I&O cath neg for casts with up:c= consistent with  prior levels of proteinuria, C3C4 complement levels wnl, Was awaiting lupus (ANA=neg), Phospholipase A2 receptor AB=neg, and HepC (nonreactive) labs done at that time as well.  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  Urine dip and spot protein/creat urine today.  Hgb today. Please refer if needed for +ppd DMPA 150 mg IM q 11-13 wks x 1 year  Patient given handout about PCP care in the community Given MVI per family planning program  Follow up in: 03/24/19 for DMPA  or as needed.

## 2019-02-14 LAB — PROTEIN / CREATININE RATIO, URINE
Creatinine, Urine: 31 mg/dL
Protein, Ur: 27.5 mg/dL
Protein/Creat Ratio: 887 mg/g creat — ABNORMAL HIGH (ref 0–200)

## 2019-02-18 NOTE — Telephone Encounter (Signed)
T/C to pt via [facility] [phone] who called pt, she picked up and then hung up on [place].  Interpreter called again and pt picked up and then hung up.  T/C with [facility] interpreter Marlene [place]  Discussed 02/13/19 spot protein/creat ratio=887 on 02/13/19 and need for primary care MD referral to nephrologist for regular evalution as it puts her at risk for renal disease.  Pt agrees to go to [facility].  Questions answered

## 2019-03-26 ENCOUNTER — Other Ambulatory Visit: Payer: Self-pay

## 2019-03-26 ENCOUNTER — Ambulatory Visit (LOCAL_COMMUNITY_HEALTH_CENTER): Payer: Self-pay

## 2019-03-26 VITALS — BP 111/74 | Ht 62.0 in | Wt 175.5 lb

## 2019-03-26 DIAGNOSIS — Z3009 Encounter for other general counseling and advice on contraception: Secondary | ICD-10-CM

## 2019-03-26 MED ORDER — MULTI-VITAMIN/MINERALS PO TABS: 1.0000 | ORAL_TABLET | Freq: Every day | ORAL | 0 refills | Status: DC

## 2019-03-26 MED ORDER — MEDROXYPROGESTERONE ACETATE 150 MG/ML IM SUSP
150.0000 mg | Freq: Once | INTRAMUSCULAR | Status: AC
  Administered 2019-03-26: 150 mg via INTRAMUSCULAR

## 2019-03-26 NOTE — Progress Notes (Signed)
Depo administered per 02/13/2019 written order of E. Sciora CNM and client tolerated without complaint. Rich Number, RN

## 2019-12-01 IMAGING — MG MM DIGITAL DIAGNOSTIC BILAT W/ TOMO W/ CAD
8 of 9 series · 8 of 17 positions shown · non-contrast
Comparison: Previous exam(s).

CLINICAL DATA: 41-year-old female recalled from baseline screening
mammogram dated 12/04/2017 for bilateral calcifications.

EXAM:
DIGITAL DIAGNOSTIC BILATERAL MAMMOGRAM WITH CAD AND TOMO

[L CC]
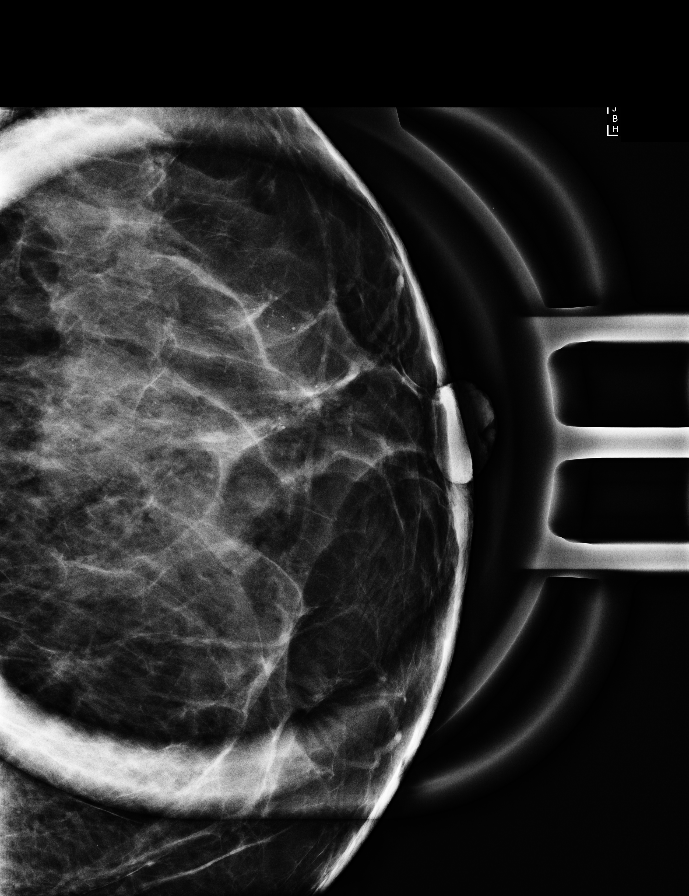

[R CC]
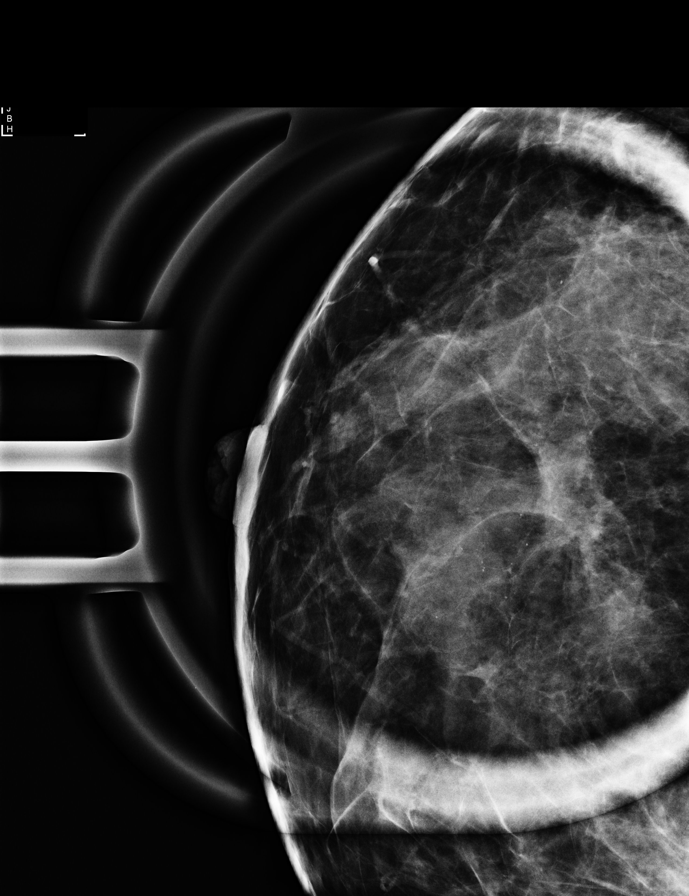

[R ML (1 of 2)]
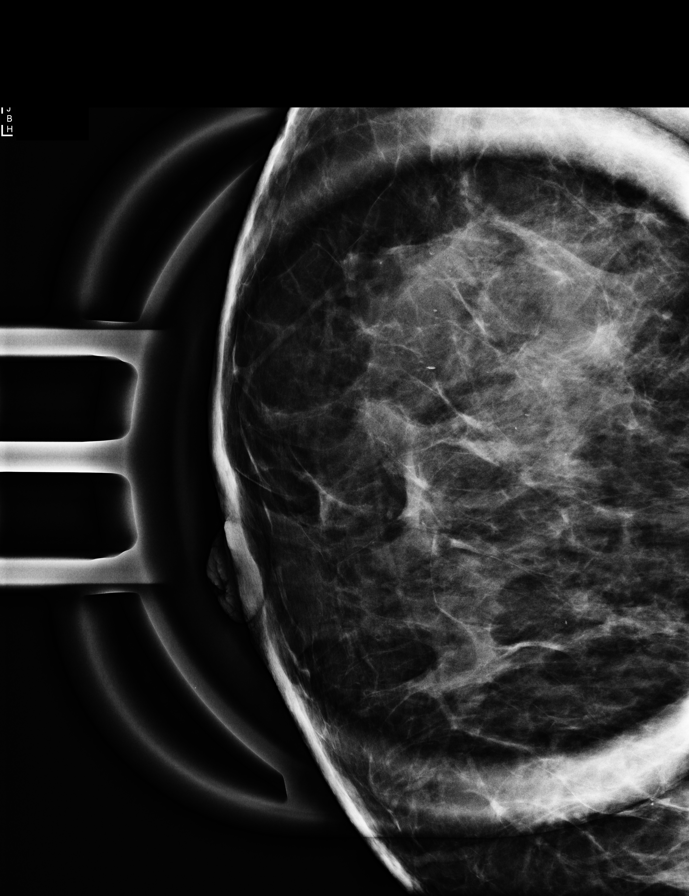

[L ML]
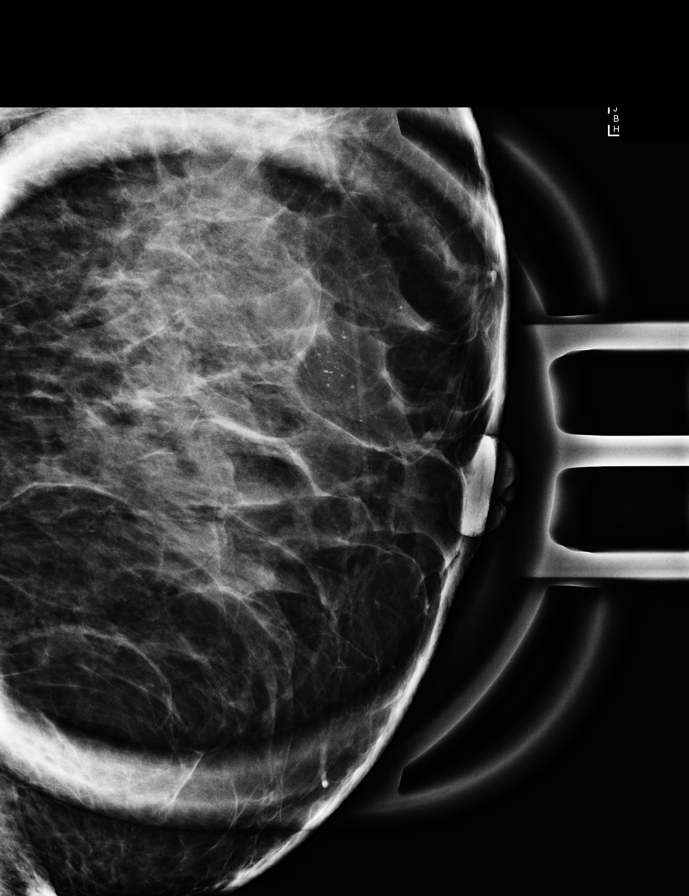

[R ML (2 of 2)]
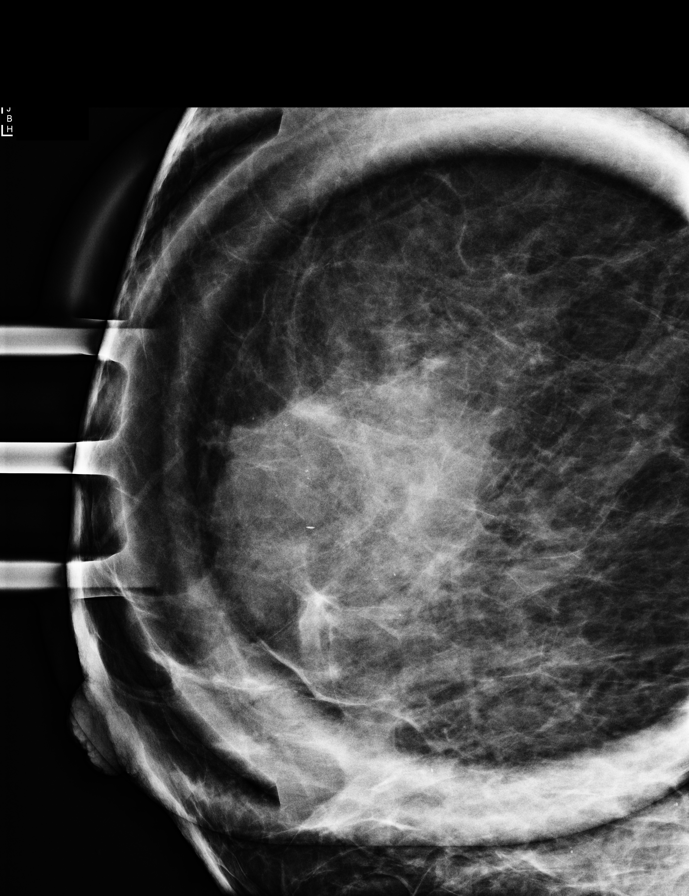

[L ML synth-2D]
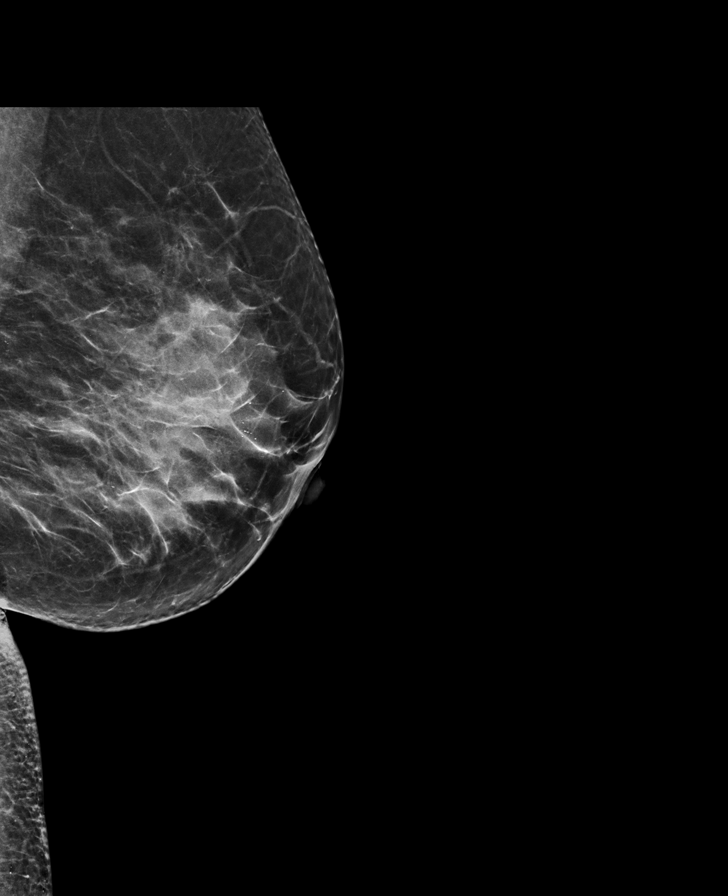

[R ML synth-2D]
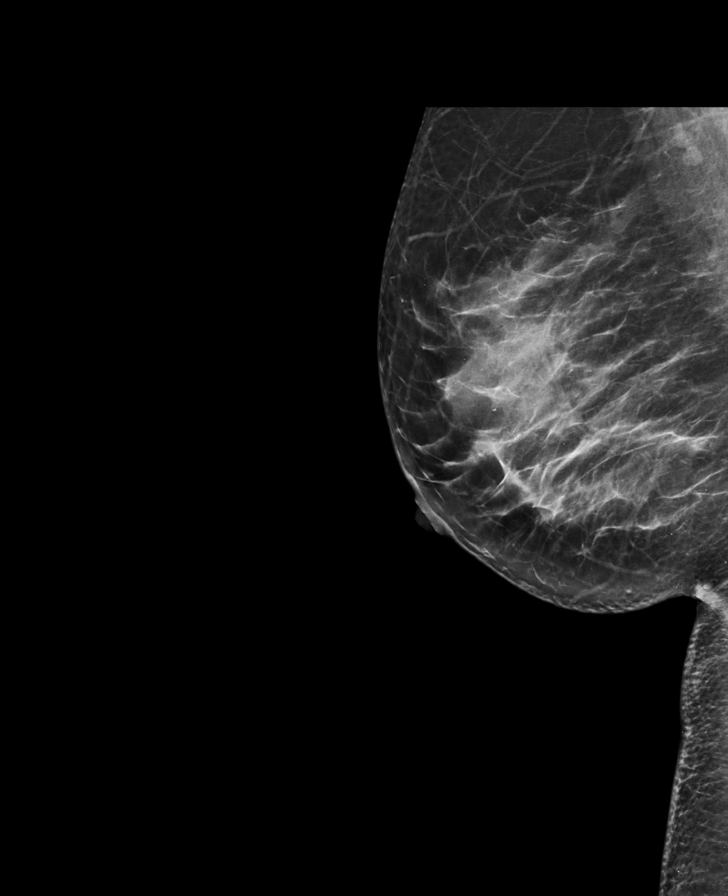

[R ML tomo · tomo slice 42/83.0]
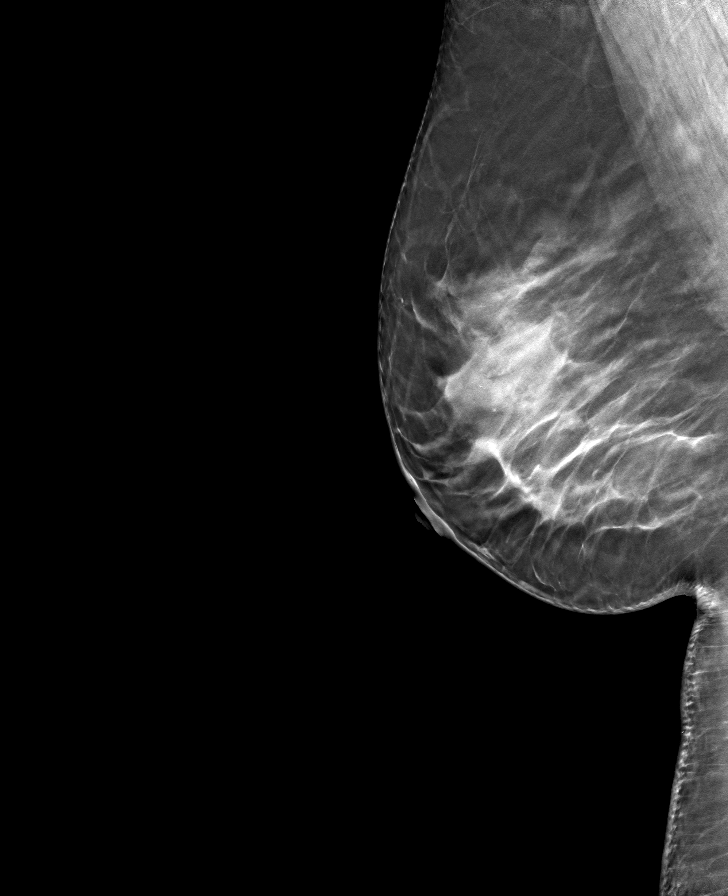

[8 of 17 positions shown; findings below may reference images not displayed]

ACR Breast Density Category c: The breast tissue is heterogeneously
dense, which may obscure small masses.
FINDINGS: Grouped calcifications in the superior central right breast at
middle depth and superior central left breast at anterior depth
demonstrate a punctate morphology with layering components on true
lateral views. Findings are most suggestive of milk of calcium.

Mammographic images were processed with CAD.
IMPRESSION: Probably benign bilateral calcifications. A few layering components
are suggestive of milk of calcium. The remaining calcifications
demonstrate a punctate morphology. Recommendation is for six-month
mammographic follow-up.

RECOMMENDATION:
Bilateral diagnostic mammogram in 6 months.

I have discussed the findings and recommendations with the patient.
Results were also provided in writing at the conclusion of the
visit. If applicable, a reminder letter will be sent to the patient
regarding the next appointment.

BI-RADS CATEGORY  3: Probably benign.

## 2022-05-02 ENCOUNTER — Encounter: Payer: Self-pay | Admitting: Family Medicine

## 2022-05-02 ENCOUNTER — Ambulatory Visit: Payer: Self-pay | Admitting: Family Medicine

## 2022-05-02 VITALS — BP 112/79 | Ht 62.0 in | Wt 187.0 lb

## 2022-05-02 DIAGNOSIS — Z3009 Encounter for other general counseling and advice on contraception: Secondary | ICD-10-CM

## 2022-05-02 DIAGNOSIS — Z559 Problems related to education and literacy, unspecified: Secondary | ICD-10-CM

## 2022-05-02 DIAGNOSIS — E669 Obesity, unspecified: Secondary | ICD-10-CM

## 2022-05-02 DIAGNOSIS — Z01419 Encounter for gynecological examination (general) (routine) without abnormal findings: Secondary | ICD-10-CM

## 2022-05-02 DIAGNOSIS — Z8742 Personal history of other diseases of the female genital tract: Secondary | ICD-10-CM

## 2022-05-02 DIAGNOSIS — E66811 Obesity, class 1: Secondary | ICD-10-CM

## 2022-05-02 NOTE — Progress Notes (Signed)
[facility] DEPARTMENT [facility] [address] Main Number: [phone]    Family Planning Visit- Initial Visit  Subjective:  Karla Harris is a 46 y.o. HF [id number] nonsmoker being seen today for an initial annual visit, PE, Pap, and to discuss reproductive life planning.  The patient is currently using Female Condom for pregnancy prevention. Patient reports   does not want a pregnancy in the next year.    Patient has the following medical conditions has Problem with literacy--2nd grade education; Obesity BMI 34.2; History of positive PPD; and Hx of ASCUS 03/2016, HPV negative on their problem list.  Chief Complaint  Patient presents with   Annual Exam    Physical, pap smear and STI screening    Patient reports no concerns today, no follow-up of abnormal pap in 2017, last mammogram 4 years ago, prefers to continue using condoms for all sex but information offered.  Patient denies concerns with current method of contraception.   Body mass index is 34.2 kg/m. - Patient is eligible for diabetes screening based on BMI and age >64?  yes HA1C ordered? yes  Patient reports 1  partner/s in last year. Desires STI screening?  No - monogamy with husband for 14 years  Has patient been screened once for HCV in the past?  No  No results found for: "HCVAB"  Does the patient have current drug use (including MJ), have a partner with drug use, and/or has been incarcerated since last result? No  If yes-- Screen for HCV through [facility]   Does the patient meet criteria for HBV testing? No  Criteria:  -Household, sexual or needle sharing contact with HBV -History of drug use -HIV positive -Those with known Hep C   Health Maintenance Due  Topic Date Due   COVID-19 Vaccine (1) Never done   Hepatitis C Screening  Never done   DTaP/Tdap/Td (1 - Tdap) Never done   PAP SMEAR-Modifier  06/02/2018   INFLUENZA VACCINE  12/21/2021   COLONOSCOPY (Pts  45-4yrs Insurance coverage will need to be confirmed)  Never done    Review of Systems  All other systems reviewed and are negative.   The following portions of the patient's history were reviewed and updated as appropriate: allergies, current medications, past family history, past medical history, past social history, past surgical history and problem list. Problem list updated.   See flowsheet for other program required questions.  Objective:   Vitals:   05/02/22 1033  BP: 112/79  Weight: 187 lb (84.8 kg)  Height: 5\' 2"  (1.575 m)    Physical Exam Vitals and nursing note reviewed.  Constitutional:      Appearance: She is obese.  HENT:     Head: Normocephalic.  Eyes:     Conjunctiva/sclera: Conjunctivae normal.  Cardiovascular:     Rate and Rhythm: Normal rate and regular rhythm.     Heart sounds: Normal heart sounds.  Pulmonary:     Breath sounds: Normal breath sounds.  Chest:  Breasts:    Right: Normal.     Left: Normal.  Abdominal:     General: Bowel sounds are normal.     Palpations: Abdomen is soft.  Genitourinary:    General: Normal vulva.     Exam position: Lithotomy position.     Vagina: Normal.     Cervix: Normal.     Uterus: Normal.      Adnexa: Right adnexa normal and left adnexa normal.  Rectum: Normal.     Comments: Pap Smear collected Musculoskeletal:        General: Normal range of motion.     Cervical back: Neck supple.  Skin:    General: Skin is warm and dry.  Neurological:     Mental Status: She is alert and oriented to person, place, and time.       Assessment and Plan:  Karla Harris is a 46 y.o. female presenting to the [facility] Department for an initial annual wellness/contraceptive visit  Contraception counseling: Reviewed options based on patient desire and reproductive life plan. Patient is interested in Female Condom. This was provided to the patient today.  Risks, benefits, and typical effectiveness  rates were reviewed.  Questions were answered.  Written information was also given to the patient to review.    The patient will follow up in  1 years for surveillance.  The patient was told to call with any further questions, or with any concerns about this method of contraception.  Emphasized use of condoms 100% of the time for STI prevention.  Need for ECP was assessed. Patient reported > 120 hours .  Reviewed options and patient desired No method of ECP, declined all    1. Well woman exam with routine gynecological exam Primary Care and Dental resource lists given Need for annual mammogram discussed, [facility] information given. - IGP, Aptima HPV - Hemoglobin A1c  2. Hx of ASCUS 03/2016, HPV negative No follow-up done, Pap collected today  3. Obesity BMI 34.2 Weight loss through diet and exercise discussed.  4. Problem with literacy--2nd grade education      No follow-ups on file.  No future appointments.  Colvin Caroli, FNP-C

## 2022-05-02 NOTE — Progress Notes (Signed)
Pt here for physical exam and pap smear.  Declined STI screening.  No in house labs performed.  Family planning packet provided to [facility], RN

## 2022-05-03 LAB — HEMOGLOBIN A1C
Est. average glucose Bld gHb Est-mCnc: 114 mg/dL
Hgb A1c MFr Bld: 5.6 % (ref 4.8–5.6)

## 2022-05-07 LAB — IGP, APTIMA HPV
HPV Aptima: NEGATIVE
PAP Smear Comment: 0

## 2023-12-06 ENCOUNTER — Other Ambulatory Visit: Payer: Self-pay

## 2023-12-06 ENCOUNTER — Encounter: Payer: Self-pay | Admitting: Family Medicine

## 2023-12-06 ENCOUNTER — Ambulatory Visit (LOCAL_COMMUNITY_HEALTH_CENTER): Payer: Self-pay | Admitting: Family Medicine

## 2023-12-06 VITALS — BP 113/74 | HR 69 | Ht 62.0 in | Wt 196.0 lb

## 2023-12-06 DIAGNOSIS — Z309 Encounter for contraceptive management, unspecified: Secondary | ICD-10-CM

## 2023-12-06 DIAGNOSIS — Z124 Encounter for screening for malignant neoplasm of cervix: Secondary | ICD-10-CM

## 2023-12-06 DIAGNOSIS — Z113 Encounter for screening for infections with a predominantly sexual mode of transmission: Secondary | ICD-10-CM

## 2023-12-06 DIAGNOSIS — N6325 Unspecified lump in the left breast, overlapping quadrants: Secondary | ICD-10-CM

## 2023-12-06 LAB — HM HIV SCREENING LAB: HM HIV Screening: NEGATIVE

## 2023-12-06 LAB — WET PREP FOR TRICH, YEAST, CLUE
Clue Cell Exam: NEGATIVE
Trichomonas Exam: NEGATIVE
Yeast Exam: NEGATIVE

## 2023-12-06 NOTE — Assessment & Plan Note (Signed)
 Found on physical exam today. Patient never before palpated this, so unsure duration. Ordered diagnostic mammogram for evaluation, needs [facility] program.

## 2023-12-06 NOTE — Patient Instructions (Addendum)
 STI screening - Today we obtained a vaginal swab to screen for gonorrhea, chlamydia, and trichomonas - We also obtained a blood sample to screen for HIV and syphilis - If the results are abnormal, I will give you a call.   Prueba de ITS - Hoy nos hicieron ignacia hisopado vaginal para Paramedic, clamidia y tricomonas - Tambin nos hicieron una muestra de sangre para detectar VIH and sfilis - Si los resultados son anormales, Engineer, structural.  Estimated time frame for results collected at the [facility] Department: Tiempo estimado para obtener los resultados en el [facility] [place] [facility] [place]: Same day El mismo da  Trichomonas Yeast / Vaguda BV (bacterial vaginosis) / VB (vaginosis bacteriana)   Within 1-2 weeks En 1-2 semanas  Gonorrhea / Gonorrea Chlamydia / Clamidia  Within 2-3 weeks En 2-3 semanas HIV / VIH Syphilis / Sfilis Hepatitis B Hepatitis C      Breast lump I have ordered a mammogram to check the breast lump on your left breast.  Please call the number on the [facility] flyer if you have not been called about an appointment in 1-2 weeks.  Bulto en el seno Le he solicitado una mamografa para revisar el bulto en su seno izquierdo. Llame al nmero que aparece en el folleto de B[facility]CP si no le han llamado para programar una cita en una o [place].

## 2023-12-06 NOTE — Progress Notes (Signed)
 [employer] [facility] DEPARTMENT [facility] [address], [address] [place] [address] Main phone: [phone]  Family Planning Visit - Repeat Yearly Visit  Subjective:  Karla Harris is a 48 y.o. [id number]  being seen today for an annual wellness visit and to discuss contraception options. The patient is currently using female condom for pregnancy prevention. Patient does not want a pregnancy in the next year.   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   Review of Systems  Constitutional:  Negative for fever, malaise/fatigue and weight loss.  Respiratory:  Negative for shortness of breath.   Cardiovascular:  Negative for chest pain and palpitations.   See flowsheet for further details and programmatic requirements Hyperlink available at the top of the signed note in blue.  Flow sheet content below:  Pregnancy Intention Screening Does the patient want to become pregnant in the next year?: No Does the patient's partner want to become pregnant in the next year?: No Would the patient like to discuss contraceptive options today?: No Results Follow up Password: J Is it okay to contact you by mail?: Yes Contraception History Past methods of contraception used by patient:: Hormonal Implant, Female Condom Adverse effects associated with Hormonal Implant: weight gain Adverse  effects associated with Female Condom: none Sexual History What age did you start your period?: 13 How often do you have your period?: monthly Date of last sex?: 12/04/23 Has the patient had unprotected sex within the last 5 days?: No Do you have sex with men, women, both men and women?: Men only In the past 2 months how many partners have you had sex with?: 1 In the past 12 months, how many partners have you had sex with?: 1 Is it possible that any of your sex partners in the past 12 months had sex with someone else whild they were still in a sexual relationship with you?: No What ways do you have sex?: Vaginal Do you or your partner use condoms and/or dental dams every time you have vaginal, oral or anal sex?: Yes Do you douche?: No Date of last HIV test?: 11/21/18 Have you ever had an STD?: No Have any of your partners had an STD?: No Have you or your partner ever shot up drugs?: No Have any of your partners used drugs in the past?: No Have you or your partners exchanged money or drugs for sex?: No Risk Factors for Hep B Household, sexual, or needle sharing contact of a person infected with Hep B: No Sexual contact with a person who uses drugs not as prescribed?: No Currently or Ever used drugs not as prescribed: No HIV Positive: No PRep Patient: No Men who have sex with men: N/A Have Hepatitis C: No History of Incarceration: No History of Homeslessness?: No Anal sex following anal drug use?: N/A Risk Factors for Hep C Currently using drugs not  as prescribed: No Sexual partner(s) currently using drugs as not prescribed: No History of drug use: No HIV Positive: No People with a history of incarceration: No People born between the years of 82 and 43: No Counseling Education: Make informed decision about family planning, Provided preconception counseling, Reduce risk of transmission and protection from STD's and HIV, Understand BMI >25 or >18.5 is a health risk (weight  management educational materials to be provided to client requests), Promoted daily consumption of MVI with folic acid if capable of conceiving., Review immunization history, inform client of recommended vaccines per CDC's ACIP Guidelines and refer to Immunization clinic, Provide GED counseling if indicated by history, Results of physical assessment and labs (if performed), How to discontinue the method selected and information on back up method used, How to use the method selected and information on back up method used, How to use the method consistently and correctly, Teach back method completed, Warning signs for rare but serious adverse events and what to do if they experience a warning sign (including emergency 24 hour number, where to seek emergency service outside of hours of operation), When to return for follow up (planned return schedule), PCP list given to patient, Is patient pregnant? Contraception Wrap Up Current Method: Female Condom End Method: Female Condom Contraception Counseling Provided: Yes How was the end contraceptive method provided?: Provided on site Interpreter Interpreters used: Gladies Teixeria  Diabetes screening This patient is 48 y.o. with a BMI of Body mass index is 35.85 kg/m.Karla Harris  Is patient eligible for diabetes screening (age >35 and BMI >25)?  yes  Was Hgb A1c ordered? yes  STI screening Patient reports 1 of partners in last year.  Does this patient desire STI screening?  Yes  Hepatitis C screening Has patient been screened once for HCV in the past?  No  No results found for: HCVAB  Does the patient meet criteria for HCV testing? No   Hepatitis B screening Does the patient meet criteria for HBV testing? No   Cervical Cancer Screening  Result Date Procedure Results Follow-ups  05/02/2022 IGP, Aptima HPV DIAGNOSIS:: Comment (A) Specimen adequacy:: Comment Clinician Provided ICD10: Comment Performed by:: Comment Electronically signed by:: Comment PAP  Smear Comment: . PATHOLOGIST PROVIDED ICD10:: Comment Note:: Comment Test Methodology: Comment HPV Aptima: Negative   06/03/2015 Pap IG w/ reflex to HPV when ASC-U      Health Maintenance Due  Topic Date Due   Hepatitis C Screening  Never done   DTaP/Tdap/Td (1 - Tdap) Never done   Hepatitis B Vaccines (1 of 3 - 19+ 3-dose series) Never done   Colonoscopy  Never done   COVID-19 Vaccine (1 - 2024-25 season) Never done   Cervical Cancer Screening (Pap smear)  05/03/2023    The following portions of the patient's history were reviewed and updated as appropriate: allergies, current medications, past family history, past medical history, past social history, past surgical history and problem list. Problem list updated.  Objective:   Vitals:   12/06/23 0839  BP: 113/74  Pulse: 69  Weight: 196 lb (88.9 kg)  Height: 5' 2 (1.575 m)    Physical Exam Vitals and nursing note reviewed. Exam conducted with a chaperone present Kemp T. interpreter).  Constitutional:      Appearance: Normal appearance.  HENT:     Head: Normocephalic and atraumatic.     Mouth/Throat:     Mouth: Mucous membranes are moist.     Pharynx: Oropharynx is clear. No oropharyngeal exudate  or posterior oropharyngeal erythema.  Eyes:     General: No scleral icterus.       Right eye: No discharge.        Left eye: No discharge.     Conjunctiva/sclera: Conjunctivae normal.  Cardiovascular:     Rate and Rhythm: Normal rate and regular rhythm.     Heart sounds: Normal heart sounds.  Pulmonary:     Effort: Pulmonary effort is normal.     Breath sounds: Normal breath sounds.  Chest:  Breasts:    Right: Normal. No swelling, bleeding, inverted nipple, mass, nipple discharge, skin change or tenderness.     Left: Mass present. No swelling, bleeding, inverted nipple, nipple discharge, skin change or tenderness.       Comments: Firm round mass palpated on left breast at 3 o'clock position about 1 inch lateral to  areola. Patient reports not feeling this previously. No overlying skin changes, erythema, drainage, or p'eau d'orange.  Abdominal:     General: Abdomen is flat.     Palpations: Abdomen is soft. There is no mass.     Tenderness: There is no abdominal tenderness. There is no rebound.  Genitourinary:    General: Normal vulva.     Exam position: Lithotomy position.     Pubic Area: No rash or pubic lice.      Tanner stage (genital): 5.     Labia:        Right: No rash or lesion.        Left: No rash or lesion.      Vagina: Vaginal discharge (Viscous whitish greenish discharge) and tenderness (Tenderness with speculum insertion) present. No erythema, bleeding or lesions.     Cervix: Discharge present. No cervical motion tenderness, friability, lesion or erythema.     Uterus: Normal.      Adnexa: Right adnexa normal and left adnexa normal.     Rectum: External hemorrhoid present.     Comments: pH = 6 Musculoskeletal:     Cervical back: Neck supple. No rigidity or tenderness.  Lymphadenopathy:     Head:     Right side of head: No preauricular or posterior auricular adenopathy.     Left side of head: No preauricular or posterior auricular adenopathy.     Cervical: No cervical adenopathy.     Upper Body:     Right upper body: No supraclavicular, axillary or pectoral adenopathy.     Left upper body: No supraclavicular, axillary or pectoral adenopathy.  Skin:    General: Skin is warm and dry.     Capillary Refill: Capillary refill takes less than 2 seconds.     Coloration: Skin is not jaundiced or pale.     Findings: No bruising, erythema, lesion or rash.  Neurological:     General: No focal deficit present.     Mental Status: She is alert and oriented to person, place, and time.  Psychiatric:        Mood and Affect: Mood normal.        Behavior: Behavior normal.    Assessment and Plan:  Karla Harris is a 48 y.o. female [phone] presenting to the [facility] Department  for an yearly wellness and contraception visit  Contraception counseling:  Reviewed options based on patient desire and reproductive life plan. Patient is interested in Female Condom. This was provided to the patient today.   Risks, benefits, and typical effectiveness rates were reviewed.  Questions were answered.  Written information was  also given to the patient to review.    The patient will follow up in  1 years for surveillance.  The patient was told to call with any further questions, or with any concerns about this method of contraception.  Emphasized use of condoms 100% of the time for STI prevention.  Emergency Contraception Precautions (ECP): Patient assessed for need of ECP. She is not a candidate based on barrier method used 100% correctly during intercourse per patient's confident report.   Screening examination for venereal disease -     WET PREP FOR TRICH, YEAST, CLUE -     HIV [place] [facility] -     Chlamydia/Gonorrhea [place] [facility] -     Syphilis Serology, [facility]  Cervical cancer screening -     IGP, Aptima HPV  Breast lump on left side at 3 o'clock position Assessment & Plan: Found on physical exam today. Patient never before palpated this, so unsure duration. Ordered diagnostic mammogram for evaluation, needs BCCCP program.     No follow-ups on file.  No future appointments.  Betsey CHRISTELLA Helling, MD

## 2023-12-06 NOTE — Progress Notes (Signed)
 Pt is here for family planning visit.  Family planning education card reviewed and given to pt.  Wet prep results reviewed, no treatment required per standing orders. Condoms given. Caren Channel, RN

## 2023-12-08 LAB — IGP, APTIMA HPV
HPV Aptima: NEGATIVE
PAP Smear Comment: 0

## 2023-12-13 ENCOUNTER — Ambulatory Visit: Payer: Self-pay | Admitting: Family Medicine

## 2023-12-13 NOTE — Progress Notes (Signed)
 PAP smear reviewed, result: NILM (normal), HPV negative. Based on this result and patient's prior cervical cancer screenings, [facility]P currently recommends repeat PAP smear in 1 year with HPV co-testing.  Karla Helling, MD 12/13/23  11:32 AM

## 2023-12-15 ENCOUNTER — Encounter: Payer: Self-pay | Admitting: Family Medicine

## 2024-01-16 ENCOUNTER — Other Ambulatory Visit: Payer: Self-pay

## 2024-01-16 ENCOUNTER — Ambulatory Visit: Payer: Self-pay | Attending: Obstetrics and Gynecology | Admitting: *Deleted

## 2024-01-16 VITALS — BP 117/71 | Wt 196.0 lb

## 2024-01-16 DIAGNOSIS — Z1239 Encounter for other screening for malignant neoplasm of breast: Secondary | ICD-10-CM

## 2024-01-16 DIAGNOSIS — N6325 Unspecified lump in the left breast, overlapping quadrants: Secondary | ICD-10-CM

## 2024-01-16 DIAGNOSIS — Z1211 Encounter for screening for malignant neoplasm of colon: Secondary | ICD-10-CM

## 2024-01-16 NOTE — Patient Instructions (Signed)
 Taught [name] how to perform BSE and gave educational materials to take home. Patient did not need a Pap smear today due to last Pap smear was in *** per patient. Told patient about free cervical cancer screenings to receive a Pap smear if would like one next year. Let her know B[facility]CP will cover Pap smears every 3 years unless has a history of abnormal Pap smears. Referred patient to the [facility] for diagnostic mammogram. Appointment scheduled for ***, 2014 at ***. Patient aware of appointment and will be there. Let patient know will follow up with her within the next couple weeks with results. Karla Harris verbalized understanding.  Karla Harris, Wanda Ship, RN @T @ 12:29 PM

## 2024-01-16 NOTE — Progress Notes (Unsigned)
 Ms. Christean Silvestri is a 48 y.o. female who presents to [facility] clinic today with {Blank single:19197::no complaints,complaint of} ***.    Pap Smear: Pap not smear completed today. Last Pap smear was *** at *** clinic and was {Blank single:19197::normal,abnormal - ***}. Per patient has {Blank single:19197::no history,history} of an abnormal Pap smear. Last Pap smear result {Blank single:19197::is available in,is not available in} Epic.   Physical exam: Breasts Breasts symmetrical. No skin abnormalities bilateral breasts. No nipple retraction bilateral breasts. No nipple discharge bilateral breasts. No lymphadenopathy. No lumps palpated bilateral breasts.       Pelvic/Bimanual Pap is not indicated today    Smoking History: Patient has {Blank single:19197::never smoked,is a former smoker,is a current smoker at *** packs per day} ***referred to quit line.    Patient Navigation: Patient education provided. Access to services provided for patient through [facility] program. *** interpreter provided. *** transportation provided   Colorectal Cancer Screening: Per patient {Blank single:19197::has had colonoscopy completed on ***,has never had colonoscopy completed} No complaints today.    Breast and Cervical Cancer Risk Assessment: Patient {Blank single:19197::has,does not have} family history of breast cancer, known genetic mutations, or radiation treatment to the chest before age 68. Patient {Blank single:19197::has,does not have} history of cervical dysplasia, immunocompromised, or DES exposure in-utero.  Risk Assessment   No risk assessment data for the current encounter  Risk Scores       12/04/2017   Last edited by: Cindie Jesusa HERO, RN   5-year risk: 0.3%   Lifetime risk: 6.4%            A: BCCCP exam without pap smear Complaint of ***  P: Referred patient to the [facility] for a {Blank single:19197::screening,diagnostic}  mammogram. Appointment scheduled ***.  Driscilla Wanda SQUIBB, RN 01/16/2024 12:31 PM

## 2024-01-17 ENCOUNTER — Ambulatory Visit
Admission: RE | Admit: 2024-01-17 | Discharge: 2024-01-17 | Disposition: A | Discharge status: Home / Self Care | Payer: Self-pay | Source: Ambulatory Visit | Attending: Obstetrics and Gynecology | Admitting: Obstetrics and Gynecology

## 2024-01-17 DIAGNOSIS — N6325 Unspecified lump in the left breast, overlapping quadrants: Secondary | ICD-10-CM

## 2024-01-26 LAB — FECAL OCCULT BLOOD, IMMUNOCHEMICAL: Fecal Occult Bld: NEGATIVE

## 2024-01-26 LAB — SPECIMEN STATUS REPORT

## 2024-01-29 ENCOUNTER — Ambulatory Visit: Payer: Self-pay
